# Patient Record
Sex: Female | Born: 1975 | Race: White | Hispanic: No | Marital: Married | Smoking: Never smoker
Health system: Southern US, Community
[De-identification: ages and names within clinical notes are randomized; demographics above are authoritative.]

---

## 2016-06-18 ENCOUNTER — Ambulatory Visit (INDEPENDENT_AMBULATORY_CARE_PROVIDER_SITE_OTHER): Payer: BLUE CROSS/BLUE SHIELD | Admitting: Sports Medicine

## 2016-06-18 ENCOUNTER — Encounter: Payer: Self-pay | Admitting: Sports Medicine

## 2016-06-18 VITALS — BP 110/79 | Ht 69.0 in | Wt 238.0 lb

## 2016-06-18 DIAGNOSIS — M722 Plantar fascial fibromatosis: Secondary | ICD-10-CM

## 2016-06-18 NOTE — Progress Notes (Signed)
   Subjective:    Patient ID: Kerry Snow, female    DOB: 03/06/1976, 40 y.o.   MRN: [id number]  HPI chief complaint: Left heel pain  Very pleasant 40 year old female comes in today at the request of Dr. Thurston HoleWainer for custom orthotics. She has a history of left heel plantar fasciitis. She last saw Dr. Thurston HoleWainer on September 18 and got a cortisone injection into her left heel. Her pain has improved but not resolved. She has had pain on and off for the past 2 years. She has tried multiple treatments but has not had custom orthotics.  Past medical history reviewed Medications reviewed Allergies reviewed    Review of Systems As above    Objective:   Physical Exam  Obese. No acute distress. Vital signs reviewed  Left heel: She is tender to palpation at the origin of the plantar fascia. Negative calcaneal squeeze. Fairly well-preserved longitudinal arch with standing but pronation with walking. Neurovascular intact distally.  X-rays of her left heel dated September 18 from Dr. Sherene SiresWainer's office show a rather large calcaneal plantar spur. Otherwise unremarkable.      Assessment & Plan:   Chronic left heel plantar fasciitis  Custom orthotics were constructed today. Patient found them to be comfortable prior to leaving the office. Total of 40 minutes was spent with the patient with greater than 50% of the time spent in face-to-face consultation discussing orthotic construction, instruction, and fitting. She will continue with her other treatments for plantar fasciitis and will follow-up with Dr. Thurston HoleWainer as scheduled. Follow-up with me as needed.

## 2017-07-17 ENCOUNTER — Encounter: Payer: Self-pay | Admitting: Allergy & Immunology

## 2018-01-01 ENCOUNTER — Other Ambulatory Visit: Payer: Self-pay | Admitting: Family Medicine

## 2018-01-01 ENCOUNTER — Ambulatory Visit
Admission: RE | Admit: 2018-01-01 | Discharge: 2018-01-01 | Disposition: A | Discharge status: Home / Self Care | Payer: BC Managed Care – PPO | Source: Ambulatory Visit | Attending: Family Medicine | Admitting: Family Medicine

## 2018-01-01 DIAGNOSIS — M546 Pain in thoracic spine: Secondary | ICD-10-CM

## 2018-01-15 ENCOUNTER — Encounter: Payer: Self-pay | Admitting: Physical Therapy

## 2018-01-15 ENCOUNTER — Other Ambulatory Visit: Payer: Self-pay

## 2018-01-15 ENCOUNTER — Ambulatory Visit: Payer: BC Managed Care – PPO | Attending: Family Medicine | Admitting: Physical Therapy

## 2018-01-15 DIAGNOSIS — M545 Low back pain, unspecified: Secondary | ICD-10-CM
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
## 2018-01-17 ENCOUNTER — Encounter: Payer: Self-pay | Admitting: Physical Therapy

## 2018-01-17 ENCOUNTER — Ambulatory Visit: Payer: BC Managed Care – PPO | Admitting: Physical Therapy

## 2018-01-17 DIAGNOSIS — M545 Low back pain, unspecified: Secondary | ICD-10-CM
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
## 2018-01-21 ENCOUNTER — Encounter: Payer: Self-pay | Admitting: Physical Therapy

## 2018-01-21 ENCOUNTER — Ambulatory Visit: Payer: BC Managed Care – PPO | Admitting: Physical Therapy

## 2018-01-21 DIAGNOSIS — M545 Low back pain, unspecified: Secondary | ICD-10-CM
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
## 2018-01-23 ENCOUNTER — Ambulatory Visit: Payer: BC Managed Care – PPO | Attending: Family Medicine | Admitting: Physical Therapy

## 2018-01-23 ENCOUNTER — Encounter: Payer: Self-pay | Admitting: Physical Therapy

## 2018-01-23 DIAGNOSIS — M545 Low back pain, unspecified: Secondary | ICD-10-CM
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
## 2018-01-28 ENCOUNTER — Ambulatory Visit: Payer: BC Managed Care – PPO | Admitting: Physical Therapy

## 2018-01-28 ENCOUNTER — Encounter: Payer: Self-pay | Admitting: Physical Therapy

## 2018-01-28 DIAGNOSIS — M545 Low back pain, unspecified: Secondary | ICD-10-CM
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
## 2018-01-30 ENCOUNTER — Encounter: Payer: Self-pay | Admitting: Physical Therapy

## 2018-01-30 ENCOUNTER — Ambulatory Visit: Payer: BC Managed Care – PPO | Admitting: Physical Therapy

## 2018-01-30 DIAGNOSIS — M545 Low back pain, unspecified: Secondary | ICD-10-CM

## 2018-01-30 NOTE — Therapy (Signed)
[facility] [address] [place], [place], [address] Phone: [phone]   Fax:  [phone]  Physical Therapy Treatment  Patient Details  Name: Kerry Snow MRN: [id number] Date of Birth: 10-May-1976 Referring Provider: Mady Gemma   Encounter Date: 01/30/2018  PT End of Session - 01/30/18 1426    Visit Number  6    Number of Visits  12    Date for PT Re-Evaluation  02/26/18    PT Start Time  1438    PT Stop Time  1519    PT Time Calculation (min)  41 min    Activity Tolerance  Patient tolerated treatment well    Behavior During Therapy  [facility] for tasks assessed/performed       History reviewed. No pertinent past medical history.  History reviewed. No pertinent surgical history.  There were no vitals filed for this visit.  Subjective Assessment - 01/30/18 1426    Subjective  Reports her pain is the same as Tuesday. Reports that she has a migraine today as well.    How long can you sit comfortably?  20 minutes.    Diagnostic tests  X-ray.    Currently in Pain?  Yes    Pain Score  7     Pain Location  Back    Pain Orientation  Right;Mid;Lower    Pain Descriptors / Indicators  Aching    Pain Type  Acute pain    Pain Onset  1 to 4 weeks ago    Pain Frequency  Constant         [facility] PT Assessment - 01/30/18 0001      Assessment   Medical Diagnosis  Thoracic back pain.    Onset Date/Surgical Date  12/25/17    Next MD Visit  None      Precautions   Precautions  None      Restrictions   Weight Bearing Restrictions  No                   [facility] Adult PT Treatment/Exercise - 01/30/18 0001      Modalities   Modalities  Electrical Stimulation;Moist Heat;Ultrasound      Moist Heat Therapy   Number Minutes Moist Heat  15 Minutes    Moist Heat Location  Lumbar Spine      Electrical Stimulation   Electrical Stimulation Location  R thoracolumbar paraspinals    Electrical Stimulation Action  Pre-Mod     Electrical Stimulation Parameters  80-150 hz x15 min    Electrical Stimulation Goals  Pain      Ultrasound   Ultrasound Location  R lumbar paraspinals/ QL    Ultrasound Parameters  Combo 1.5 w/cm2, 100%, 1 mhz x10 min    Ultrasound Goals  Pain      Manual Therapy   Manual Therapy  Soft tissue mobilization    Myofascial Release  STW/MFR to R thoracolumbar paraspinals and QL to reduce muscle tightness and stiffness in prone; Thoracic and lumbar mobs as well as costovertebral mobs completed by [place] Applegate, MPT                  PT Long Term Goals - 01/15/18 1354      PT LONG TERM GOAL #1   Title  Ind with a HEP.    Time  6    Period  Weeks    Status  New      PT LONG TERM GOAL #2  Title  Sit 30 minutes with pain not > 2/10.    Time  6    Period  Weeks    Status  New      PT LONG TERM GOAL #3   Title  Perform ADL's with pain not > 2/10.    Time  6    Period  Weeks    Status  New            Plan - 01/30/18 1520    Clinical Impression Statement  Patient tolerated today's treatment fairly well as she arrived with increased R LBP as well as headache symptoms. Moderate R QL tightness palpated today and minimal to moderate low trap region tightness. Patient reported manual therapy to R low back feeling like palpating bruising. Patient also reported experiencing mid back stiffness to wich [place] Applegate, MPT completed thoracic and lumbar mobs as well as costovertebral mobs to reduce spinal stiffness. Normal modalities response noted following removal of the modalities.     Rehab Potential  Excellent    PT Frequency  2x / week    PT Duration  6 weeks    PT Treatment/Interventions  ADLs/Self Care Home Management;Cryotherapy;Electrical Stimulation;Ultrasound;Moist Heat;Therapeutic activities;Therapeutic exercise;Patient/family education;Manual techniques;Dry needling    PT Next Visit Plan  Assess conservative treatment response and possible progression to  strengthening.    Consulted and Agree with Plan of Care  Patient       Patient will benefit from skilled therapeutic intervention in order to improve the following deficits and impairments:  Pain  Visit Diagnosis: Acute right-sided low back pain without sciatica     Problem List There are no active problems to display for this patient.   Marvell Fuller, PTA 01/30/2018, 3:26 PM  [facility] [address] [place], [place], [address] Phone: [phone]   Fax:  [phone]  Name: Shima Compere MRN: [id number] Date of Birth: 03/05/1976

## 2018-01-30 NOTE — Therapy (Deleted)
[facility] [address] [place], [place], [address] Phone: [phone]   Fax:  [phone]  Jan 30, 2018   @  Physical Therapy Discharge Summary  Patient: Kerry Snow  MRN: [id number]  Date of Birth: 02-10-1976   Diagnosis: Acute right-sided low back pain without sciatica Referring Provider: Mady Gemma   The above patient had been seen in Physical Therapy *** times of *** treatments scheduled with *** no shows and *** cancellations.  The treatment consisted of *** The patient is: {improved/worse/unchanged:3041574}  Subjective: ***  Discharge Findings: ***  Functional Status at Discharge: ***  {XLKGM:[id number]}  Plan - 01/30/18 1520    Clinical Impression Statement  Patient tolerated today's treatment fairly well as she arrived with increased R LBP as well as headache symptoms. Moderate R QL tightness palpated today and minimal to moderate low trap region tightness. Patient reported manual therapy to R low back feeling like palpating bruising. Patient also reported experiencing mid back stiffness to wich [place] Applegate, MPT completed thoracic and lumbar mobs as well as costovertebral mobs to reduce spinal stiffness. Normal modalities response noted following removal of the modalities.     Rehab Potential  Excellent    PT Frequency  2x / week    PT Duration  6 weeks    PT Treatment/Interventions  ADLs/Self Care Home Management;Cryotherapy;Electrical Stimulation;Ultrasound;Moist Heat;Therapeutic activities;Therapeutic exercise;Patient/family education;Manual techniques;Dry needling    PT Next Visit Plan  Assess conservative treatment response and possible progression to strengthening.    Consulted and Agree with Plan of Care  Patient       Sincerely,   Marvell Fuller, PTA   CC @  [facility] [address] [place], [place], [address] Phone:  [phone]   Fax:  [phone]  Patient: Kerry Snow  MRN: [id number]  Date of Birth: Mar 31, 1976

## 2018-02-04 ENCOUNTER — Encounter: Payer: Self-pay | Admitting: Physical Therapy

## 2018-02-04 ENCOUNTER — Ambulatory Visit: Payer: BC Managed Care – PPO | Admitting: Physical Therapy

## 2018-02-04 DIAGNOSIS — M545 Low back pain, unspecified: Secondary | ICD-10-CM

## 2018-02-04 NOTE — Therapy (Signed)
[facility] [address] [place], [place], [address] Phone: [phone]   Fax:  [phone]  Physical Therapy Treatment  Patient Details  Name: Kerry Snow MRN: [id number] Date of Birth: 19-Oct-1975 Referring Provider: Mady Gemma   Encounter Date: 02/04/2018  PT End of Session - 02/04/18 1355    Visit Number  7    Number of Visits  12    Date for PT Re-Evaluation  02/26/18    PT Start Time  1349    PT Stop Time  1436    PT Time Calculation (min)  47 min    Activity Tolerance  Patient tolerated treatment well    Behavior During Therapy  [facility] for tasks assessed/performed       History reviewed. No pertinent past medical history.  History reviewed. No pertinent surgical history.  There were no vitals filed for this visit.  Subjective Assessment - 02/04/18 1348    Subjective  Reports that she rested over the weekend and even took yesterday off as she felt she had a stomach bug over the weekend. Reports that she has lower LBP but feels it predominately in upper R back.     How long can you sit comfortably?  20 minutes and depends on the type of chair    How long can you stand comfortably?  approx. 15 minutes    Diagnostic tests  X-ray.    Currently in Pain?  Yes    Pain Score  3     Pain Location  Back    Pain Orientation  Right;Mid;Lower    Pain Descriptors / Indicators  Discomfort    Pain Type  Acute pain    Pain Onset  1 to 4 weeks ago    Pain Frequency  Intermittent         [facility] PT Assessment - 02/04/18 0001      Assessment   Medical Diagnosis  Thoracic back pain.    Onset Date/Surgical Date  12/25/17    Next MD Visit  None      Precautions   Precautions  None      Restrictions   Weight Bearing Restrictions  No                   [facility] Adult PT Treatment/Exercise - 02/04/18 0001      Exercises   Exercises  Lumbar      Lumbar Exercises: Aerobic   Nustep  L4 x16 min      Modalities    Modalities  Electrical Stimulation;Moist Heat;Ultrasound      Moist Heat Therapy   Number Minutes Moist Heat  15 Minutes    Moist Heat Location  Lumbar Spine      Electrical Stimulation   Electrical Stimulation Location  R thoracolumbar paraspinals    Electrical Stimulation Action  Pre-Mod    Electrical Stimulation Parameters  80-150 hz x15 min    Electrical Stimulation Goals  Pain      Ultrasound   Ultrasound Location  R thoracolumbar paraspinals    Ultrasound Parameters  Combo 1.5 w/cm2, 100%, x10 min    Ultrasound Goals  Pain      Manual Therapy   Manual Therapy  --                  PT Long Term Goals - 02/04/18 1358      PT LONG TERM GOAL #1   Title  Ind with a HEP.  Time  6    Period  Weeks    Status  On-going      PT LONG TERM GOAL #2   Title  Sit 30 minutes with pain not > 2/10.    Time  6    Period  Weeks    Status  On-going      PT LONG TERM GOAL #3   Title  Perform ADL's with pain not > 2/10.    Time  6    Period  Weeks    Status  On-going 5-6/10 as of 02/04/2018            Plan - 02/04/18 1426    Clinical Impression Statement  Patient tolerated today's treatment well and presented with less pain in R low back. Patient able to try low level exercise on Nustep with instruction regarding core activation. More discomfort reported by patient in R upper back region per patient report. Normal modalities response noted following removal of the modalities.    Rehab Potential  Excellent    PT Frequency  2x / week    PT Duration  6 weeks    PT Treatment/Interventions  ADLs/Self Care Home Management;Cryotherapy;Electrical Stimulation;Ultrasound;Moist Heat;Therapeutic activities;Therapeutic exercise;Patient/family education;Manual techniques;Dry needling    PT Next Visit Plan  Attempt progressing into more core/lumbar strengthening with modalities as symptoms dictate.    Consulted and Agree with Plan of Care  Patient       Patient will  benefit from skilled therapeutic intervention in order to improve the following deficits and impairments:  Pain  Visit Diagnosis: Acute right-sided low back pain without sciatica     Problem List There are no active problems to display for this patient.   Marvell Fuller, PTA 02/04/2018, 2:42 PM  [facility] [address] [place], [place], [address] Phone: [phone]   Fax:  [phone]  Name: Zlaty Alexa MRN: [id number] Date of Birth: 09/04/1976

## 2018-02-06 ENCOUNTER — Ambulatory Visit: Payer: BC Managed Care – PPO | Admitting: Physical Therapy

## 2018-02-06 ENCOUNTER — Encounter: Payer: Self-pay | Admitting: Physical Therapy

## 2018-02-06 DIAGNOSIS — M545 Low back pain, unspecified: Secondary | ICD-10-CM

## 2018-02-06 NOTE — Therapy (Signed)
[facility] [address] [place], [place], [address] Phone: [phone]   Fax:  [phone]  Physical Therapy Treatment  Patient Details  Name: Kerry Snow MRN: [id number] Date of Birth: 11-12-1975 Referring Provider: Mady Gemma   Encounter Date: 02/06/2018  PT End of Session - 02/06/18 1606    Visit Number  8    Number of Visits  12    Date for PT Re-Evaluation  02/26/18    PT Start Time  1604    PT Stop Time  1650    PT Time Calculation (min)  46 min    Activity Tolerance  Patient tolerated treatment well    Behavior During Therapy  [facility] for tasks assessed/performed       History reviewed. No pertinent past medical history.  History reviewed. No pertinent surgical history.  There were no vitals filed for this visit.  Subjective Assessment - 02/06/18 1604    Subjective  Reports that she has tried to be more conscious of monitoring symptoms. Reports that discomfort in low back is more dull ache and no longer sharp. Reports sleeping better as well with less stiffness.    How long can you sit comfortably?  20 minutes and depends on the type of chair    How long can you stand comfortably?  approx. 15 minutes    Diagnostic tests  X-ray.    Currently in Pain?  Yes    Pain Score  2     Pain Location  Back    Pain Orientation  Right;Mid;Lower    Pain Descriptors / Indicators  Dull;Aching    Pain Type  Acute pain    Pain Onset  1 to 4 weeks ago         [facility] PT Assessment - 02/06/18 0001      Assessment   Medical Diagnosis  Thoracic back pain.    Onset Date/Surgical Date  12/25/17    Next MD Visit  None      Precautions   Precautions  None      Restrictions   Weight Bearing Restrictions  No                   [facility] Adult PT Treatment/Exercise - 02/06/18 0001      Lumbar Exercises: Aerobic   Nustep  L5 x12 min      Lumbar Exercises: Standing   Row  Strengthening;Both;20 reps;Limitations    Row Limitations  Pink XTS with core activation    Shoulder Extension  Strengthening;Both;20 reps;Limitations    Shoulder Extension Limitations  Lat pulldown pink XTS with core activation      Modalities   Modalities  Electrical Stimulation;Moist Heat;Ultrasound      Moist Heat Therapy   Number Minutes Moist Heat  15 Minutes    Moist Heat Location  Lumbar Spine      Electrical Stimulation   Electrical Stimulation Location  R thoracolumbar paraspinals    Electrical Stimulation Action  Pre-Mod    Electrical Stimulation Parameters  80-150 hz x15 min    Electrical Stimulation Goals  Pain      Ultrasound   Ultrasound Location  R QL    Ultrasound Parameters  Combo 1.5 w/cm2, 100% x10 min    Ultrasound Goals  Pain                  PT Long Term Goals - 02/04/18 1358      PT LONG TERM GOAL #1  Title  Ind with a HEP.    Time  6    Period  Weeks    Status  On-going      PT LONG TERM GOAL #2   Title  Sit 30 minutes with pain not > 2/10.    Time  6    Period  Weeks    Status  On-going      PT LONG TERM GOAL #3   Title  Perform ADL's with pain not > 2/10.    Time  6    Period  Weeks    Status  On-going 5-6/10 as of 02/04/2018            Plan - 02/06/18 1642    Clinical Impression Statement  Patient arrived to PT with reports of only dull ache discomfort now and decreased intensity. Patient able to tolerate sitting if in a good chair per patient report. Patient able to tolerate initial postural strengthening well with only ache reported by patient more with row. Core activation VCs and education provided during standing postural strengthening. Patient very sensitive to [place] and palpation over R lateral QL/flank region. Normal modalities response noted following removal of the modalities. Goals remain on-going secondary to pain with ADLs, sitting.    Rehab Potential  Excellent    PT Frequency  2x / week    PT Duration  6 weeks    PT Treatment/Interventions  ADLs/Self  Care Home Management;Cryotherapy;Electrical Stimulation;Ultrasound;Moist Heat;Therapeutic activities;Therapeutic exercise;Patient/family education;Manual techniques;Dry needling    PT Next Visit Plan  Attempt progressing into more core/lumbar strengthening with modalities as symptoms dictate.    Consulted and Agree with Plan of Care  Patient       Patient will benefit from skilled therapeutic intervention in order to improve the following deficits and impairments:  Pain  Visit Diagnosis: Acute right-sided low back pain without sciatica     Problem List There are no active problems to display for this patient.   Marvell Fuller, PTA 02/06/2018, 5:36 PM  [facility] [address] [place], [place], [address] Phone: [phone]   Fax:  [phone]  Name: Kaydee Magel MRN: [id number] Date of Birth: 1976-01-27

## 2018-02-11 ENCOUNTER — Encounter: Payer: Self-pay | Admitting: Physical Therapy

## 2018-02-11 ENCOUNTER — Ambulatory Visit: Payer: BC Managed Care – PPO | Admitting: Physical Therapy

## 2018-02-11 DIAGNOSIS — M545 Low back pain, unspecified: Secondary | ICD-10-CM

## 2018-02-11 NOTE — Therapy (Signed)
[facility] [address] [place], [place], [address] Phone: [phone]   Fax:  [phone]  Physical Therapy Treatment  Patient Details  Name: Kerry Snow MRN: [id number] Date of Birth: 03-14-1976 Referring Provider: Mady Gemma   Encounter Date: 02/11/2018  PT End of Session - 02/11/18 1131    Visit Number  9    Number of Visits  12    Date for PT Re-Evaluation  02/26/18    PT Start Time  1121    PT Stop Time  1206    PT Time Calculation (min)  45 min    Activity Tolerance  Patient tolerated treatment well    Behavior During Therapy  [facility] for tasks assessed/performed       History reviewed. No pertinent past medical history.  History reviewed. No pertinent surgical history.  There were no vitals filed for this visit.  Subjective Assessment - 02/11/18 1123    Subjective  Reported sitting indian style on the floor this weekend and has had increased pain ever since. Reports having to take tylenol since incident. Denies any pain with sitting in recliner or other chairs but knows now not to sit in the floor.    How long can you sit comfortably?  20 minutes and depends on the type of chair    How long can you stand comfortably?  approx. 15 minutes    Diagnostic tests  X-ray.    Currently in Pain?  Yes    Pain Score  4     Pain Location  Back    Pain Orientation  Right;Lower    Pain Descriptors / Indicators  Discomfort    Pain Type  Acute pain    Pain Onset  1 to 4 weeks ago         [facility] PT Assessment - 02/11/18 0001      Assessment   Medical Diagnosis  Thoracic back pain.    Onset Date/Surgical Date  12/25/17    Next MD Visit  None      Precautions   Precautions  None      Restrictions   Weight Bearing Restrictions  No                   [facility] Adult PT Treatment/Exercise - 02/11/18 0001      Lumbar Exercises: Stretches   Lower Trunk Rotation  5 reps;30 seconds      Lumbar Exercises:  Aerobic   Nustep  L5 x17 min      Lumbar Exercises: Supine   Bridge  10 reps;4 seconds      Modalities   Modalities  Electrical Stimulation;Moist Heat      Moist Heat Therapy   Number Minutes Moist Heat  15 Minutes    Moist Heat Location  Lumbar Spine      Electrical Stimulation   Electrical Stimulation Location  R thoracolumbar paraspinals    Electrical Stimulation Action  Pre-Mod    Electrical Stimulation Parameters  80-150 hz x15 min    Electrical Stimulation Goals  Pain                  PT Long Term Goals - 02/04/18 1358      PT LONG TERM GOAL #1   Title  Ind with a HEP.    Time  6    Period  Weeks    Status  On-going      PT LONG TERM GOAL #2   Title  Sit 30 minutes with pain not > 2/10.    Time  6    Period  Weeks    Status  On-going      PT LONG TERM GOAL #3   Title  Perform ADL's with pain not > 2/10.    Time  6    Period  Weeks    Status  On-going 5-6/10 as of 02/04/2018            Plan - 02/11/18 1158    Clinical Impression Statement  Patient tolerated today's treatment well following minor set back due to sitting in [place] position over the weekend. Patient able to experience R flank stretch with LTR stretch. Lighter treatment completed today as to not exaggerate R low back discomfort. Normal modalities response noted following removal of the modalities. VCs provided throughout treatment to ensure proper technique and correct any deficits. Goal remain on-going at this time secondary to ADLs and sitting pain.    Rehab Potential  Excellent    PT Frequency  2x / week    PT Duration  6 weeks    PT Treatment/Interventions  ADLs/Self Care Home Management;Cryotherapy;Electrical Stimulation;Ultrasound;Moist Heat;Therapeutic activities;Therapeutic exercise;Patient/family education;Manual techniques;Dry needling    PT Next Visit Plan  Attempt progressing into more core/lumbar strengthening with modalities as symptoms dictate.    Consulted and Agree  with Plan of Care  Patient       Patient will benefit from skilled therapeutic intervention in order to improve the following deficits and impairments:  Pain  Visit Diagnosis: Acute right-sided low back pain without sciatica     Problem List There are no active problems to display for this patient.   Marvell Fuller, PTA 02/11/2018, 12:08 PM  [facility] [address] [place], [place], [address] Phone: [phone]   Fax:  [phone]  Name: Kerry Snow MRN: [id number] Date of Birth: 1976-03-21

## 2018-02-13 ENCOUNTER — Encounter: Payer: Self-pay | Admitting: Physical Therapy

## 2018-02-13 ENCOUNTER — Ambulatory Visit: Payer: BC Managed Care – PPO | Admitting: Physical Therapy

## 2018-02-13 DIAGNOSIS — M545 Low back pain, unspecified: Secondary | ICD-10-CM

## 2018-02-13 NOTE — Therapy (Addendum)
[facility] [address] [place], [place], [address] Phone: [phone]   Fax:  [phone]  Physical Therapy Treatment  Patient Details  Name: Kerry Snow MRN: [id number] Date of Birth: December 29, 1975 Referring Provider: Mady Gemma   Encounter Date: 02/13/2018  PT End of Session - 02/13/18 1041    Visit Number  10    Number of Visits  12    Date for PT Re-Evaluation  02/26/18    PT Start Time  1041    PT Stop Time  1122    PT Time Calculation (min)  41 min    Activity Tolerance  Patient tolerated treatment well    Behavior During Therapy  [facility] for tasks assessed/performed       History reviewed. No pertinent past medical history.  History reviewed. No pertinent surgical history.  There were no vitals filed for this visit.  Subjective Assessment - 02/13/18 1040    Subjective  Reports more distinctive pain today from lumbar to R flank. Reports like bruise sensation with palpation. More walking this morning.    How long can you sit comfortably?  20 minutes and depends on the type of chair    How long can you stand comfortably?  approx. 15 minutes    Diagnostic tests  X-ray.    Currently in Pain?  Yes    Pain Score  4     Pain Location  Back    Pain Orientation  Right    Pain Descriptors / Indicators  Discomfort;Tender    Pain Type  Acute pain    Pain Onset  1 to 4 weeks ago    Pain Frequency  Constant         [facility] PT Assessment - 02/13/18 0001      Assessment   Medical Diagnosis  Thoracic back pain.    Onset Date/Surgical Date  12/25/17    Next MD Visit  None      Precautions   Precautions  None      Restrictions   Weight Bearing Restrictions  No                   [facility] Adult PT Treatment/Exercise - 02/13/18 0001      Modalities   Modalities  Electrical Stimulation;Moist Heat;Ultrasound      Moist Heat Therapy   Number Minutes Moist Heat  15 Minutes    Moist Heat Location  Lumbar  Spine      Electrical Stimulation   Electrical Stimulation Location  R thoracolumbar paraspinals    Electrical Stimulation Action  Pre-Mod    Electrical Stimulation Parameters  80-150 hz x15 min    Electrical Stimulation Goals  Pain      Ultrasound   Ultrasound Location  R QL    Ultrasound Parameters  Combo 1.5 w/cm2, 100%, 1 mhz x10 min    Ultrasound Goals  Pain      Manual Therapy   Manual Therapy  Soft tissue mobilization    Soft tissue mobilization  STW to R QL, lumbar paraspinals to reduce muscle tightness and pain             PT Education - 02/13/18 1135    Education provided  Yes    Education Details  HEP- yoga QL stretch     Person(s) Educated  Patient    Methods  Explanation;Demonstration    Comprehension  Verbalized understanding          PT Long Term  Goals - 02/04/18 1358      PT LONG TERM GOAL #1   Title  Ind with a HEP.    Time  6    Period  Weeks    Status  On-going      PT LONG TERM GOAL #2   Title  Sit 30 minutes with pain not > 2/10.    Time  6    Period  Weeks    Status  On-going      PT LONG TERM GOAL #3   Title  Perform ADL's with pain not > 2/10.    Time  6    Period  Weeks    Status  On-going 5-6/10 as of 02/04/2018            Plan - 02/13/18 1129    Clinical Impression Statement  Patient tolerated today's treatment well although still experiencing "distinctive" discomfort in R low back. Patient continues to present with muscle tightness of R Ql and lumbar paraspinals with continued tenderness over R QL. Patient educated regarding DN in POC and getting home stim unit with patient understanding. PTA also demonstrated yoga QL stretch in kneeling with patient verbalzing understanding of technique. Normal modalities response noted following removal of the modalities.    Rehab Potential  Excellent    PT Frequency  2x / week    PT Duration  6 weeks    PT Treatment/Interventions  ADLs/Self Care Home Management;Cryotherapy;Electrical  Stimulation;Ultrasound;Moist Heat;Therapeutic activities;Therapeutic exercise;Patient/family education;Manual techniques;Dry needling    PT Next Visit Plan  Attempt progressing into more core/lumbar strengthening with modalities as symptoms dictate.    Consulted and Agree with Plan of Care  Patient       Patient will benefit from skilled therapeutic intervention in order to improve the following deficits and impairments:  Pain  Visit Diagnosis: Acute right-sided low back pain without sciatica     Problem List There are no active problems to display for this patient.   Marvell Fuller, PTA 02/13/2018, 11:37 AM  [facility] [address] [place], [place], [address] Phone: [phone]   Fax:  [phone]  Name: Kerry Snow MRN: [id number] Date of Birth: May 19, 1976  Progress Note Reporting Period 01/15/18 to 02/11/18  See note below for Objective Data and Assessment of Progress/Goals. Pain lower now since initial evaluation.  However, patient continues to c/o right sided low back pain that is not consistently low yet.  Patient considering dry needling as a treatment option.  [place] MPT

## 2018-02-18 ENCOUNTER — Encounter: Payer: Self-pay | Admitting: Physical Therapy

## 2018-02-18 ENCOUNTER — Ambulatory Visit: Payer: BC Managed Care – PPO | Admitting: Physical Therapy

## 2018-02-18 DIAGNOSIS — M545 Low back pain, unspecified: Secondary | ICD-10-CM

## 2018-02-18 NOTE — Therapy (Signed)
[facility] [address] [place], [place], [address] Phone: [phone]   Fax:  [phone]  Physical Therapy Treatment  Patient Details  Name: Kerry Snow MRN: [id number] Date of Birth: 1976-04-08 Referring Provider: Mady Gemma   Encounter Date: 02/18/2018  PT End of Session - 02/18/18 1735    Visit Number  11    Number of Visits  12    Date for PT Re-Evaluation  02/26/18    PT Start Time  0318    PT Stop Time  0408    PT Time Calculation (min)  50 min    Activity Tolerance  Patient tolerated treatment well    Behavior During Therapy  [facility] for tasks assessed/performed       History reviewed. No pertinent past medical history.  History reviewed. No pertinent surgical history.  There were no vitals filed for this visit.  Subjective Assessment - 02/18/18 1737    Subjective  Not doing too bad today.    Pain Score  3     Pain Location  Back    Pain Orientation  Right    Pain Descriptors / Indicators  Discomfort;Tender    Pain Type  Acute pain    Pain Onset  1 to 4 weeks ago                       [facility] Adult PT Treatment/Exercise - 02/18/18 0001      Exercises   Exercises  Knee/Hip      Lumbar Exercises: Aerobic   Nustep  Level 3 x 10 minutes.      Modalities   Modalities  Electrical Stimulation;Moist Heat      Moist Heat Therapy   Number Minutes Moist Heat  20 Minutes    Moist Heat Location  Lumbar Spine      Electrical Stimulation   Electrical Stimulation Location  Right low back.    Electrical Stimulation Action  Pre-mod.    Electrical Stimulation Parameters  80-150 Hz x 20 minutes.    Electrical Stimulation Goals  Pain      Manual Therapy   Manual Therapy  Soft tissue mobilization    Soft tissue mobilization  In prone;  STW/M including right QL release technique x 13 minutes.                  PT Long Term Goals - 02/04/18 1358      PT LONG TERM GOAL #1   Title   Ind with a HEP.    Time  6    Period  Weeks    Status  On-going      PT LONG TERM GOAL #2   Title  Sit 30 minutes with pain not > 2/10.    Time  6    Period  Weeks    Status  On-going      PT LONG TERM GOAL #3   Title  Perform ADL's with pain not > 2/10.    Time  6    Period  Weeks    Status  On-going 5-6/10 as of 02/04/2018            Plan - 02/18/18 1739    Clinical Impression Statement  Patient did well today though her QL was quite tender to palpation.  She was provided information on dry needling including an informed consent form as she is considering trying it.    PT Treatment/Interventions  ADLs/Self Care Home  Management;Cryotherapy;Electrical Stimulation;Ultrasound;Moist Heat;Therapeutic activities;Therapeutic exercise;Patient/family education;Manual techniques;Dry needling    PT Next Visit Plan  Attempt progressing into more core/lumbar strengthening with modalities as symptoms dictate.    Consulted and Agree with Plan of Care  Patient       Patient will benefit from skilled therapeutic intervention in order to improve the following deficits and impairments:     Visit Diagnosis: Acute right-sided low back pain without sciatica     Problem List There are no active problems to display for this patient.   Camara Rosander, [place] MPT 02/18/2018, 5:43 PM  [facility] [address] [place], [place], [address] Phone: [phone]   Fax:  [phone]  Name: Kerry Snow MRN: [id number] Date of Birth: 1976/05/10

## 2018-02-18 NOTE — Patient Instructions (Signed)
[facility]  DRY NEEDLING CONSENT FORM   Trigger point dry needling is a physical therapy approach to treat Myofascial Pain and Dysfunction.  Dry Needling (DN) is a valuable and effective way to deactivate myofascial trigger points (muscle knots/pain). It is skilled intervention that uses a thin filiform needle to penetrate the skin and stimulate underlying myofascial trigger points, muscular, and connective tissues for the management of neuromusculoskeletal pain and movement impairments.  A local twitch response (LTR) will be elicited.  This can sometimes feel like a deep ache in the muscle during the procedure. Multiple trigger points in multiple muscles can be treated during each treatment.  No medication of any kind is injected.   As with any medical treatment and procedure, there are possible adverse events.  While significant adverse events are uncommon, they do sometimes occur and must be considered prior to giving consent.  1. Dry needling often causes a "post needling soreness".  There can be an increase in pain from a couple of hours to 2-3 days, followed by an improvement in the overall pain state. 2. Any time a needle is used there is a risk of infection.  However, we are using new, sterile, and disposable needles; infections are extremely rare. 3. There is a possibility that you may bleed or bruise.  You may feel tired and some nausea following treatment. 4. There is a rare possibility of a pneumothorax (air in the chest cavity). 5. Allergic reaction to nickel in the stainless steel needle. 6. If a nerve is touched, it may cause paresthesia (a prickling/shock sensation) which is usually brief, but may continue for a couple of days.  Following treatment stay hydrated.  Continue regular activities but not too vigorous initially after treatment for 24-48 hours.  Dry Needling is best when combined with other physical therapy interventions such as  strengthening, stretching and other therapeutic modalities.   PLEASE ANSWER THE FOLLOWING QUESTIONS:  Do you have a lack of sensation?   Y/N  Do you have a phobia or fear of needles  Y/N  Are you pregnant?    Y/N If yes:  How many weeks? __________ Do you have any implanted devices?  Y/N If yes:  Pacemaker/Spinal Cord Stimulator/Deep Brain Stimulator/Insulin Pump/Other: ________________ Do you have any implants?  Y/N If yes: Breast/Facial/Pecs/Buttocks/Calves/Hip  Replacement/ Knee Replacement/Other: _________ Do you take any blood thinners?   Y/N If yes: Coumadin (Warfarin)/Other: ___________________ Do you have a bleeding disorder?   Y/N If yes: What kind: _________________________________ Do you take any immunosuppressants?  Y/N If yes:   What kind: _________________________________ Do you take anti-inflammatories?   Y/N If yes: What kind: Advil/Aspirin/Other: ________________ Have you ever been diagnosed with Scoliosis? Y/N Have you had back surgery?   Y/N If yes:  Laminectomy/Fusion/Other: ___________________   I have read, or had read to me, the above.  I have had the opportunity to ask any questions.  All of my questions have been answered to my satisfaction and I understand the risks involved with dry needling.  I consent to examination and treatment at [facility], including dry needling, of any and all of my involved and affected muscles.  

## 2018-02-20 ENCOUNTER — Encounter: Payer: Self-pay | Admitting: Physical Therapy

## 2018-02-20 ENCOUNTER — Ambulatory Visit: Payer: BC Managed Care – PPO | Admitting: Physical Therapy

## 2018-02-20 DIAGNOSIS — M545 Low back pain, unspecified: Secondary | ICD-10-CM

## 2018-02-20 NOTE — Therapy (Signed)
[facility] [place], [place], [address] Phone: [phone]   Fax:  [phone]  Physical Therapy Treatment  Patient Details  Name: Anari Evitt MRN: [id number] Date of Birth: 1976-04-09 Referring Provider: Bing Matter   Encounter Date: 02/20/2018  PT End of Session - 02/20/18 1523    Visit Number  12    Number of Visits  12    Date for PT Re-Evaluation  02/26/18    PT Start Time  1520    PT Stop Time  1607    PT Time Calculation (min)  47 min    Activity Tolerance  Patient tolerated treatment well    Behavior During Therapy  [facility] for tasks assessed/performed       History reviewed. No pertinent past medical history.  History reviewed. No pertinent surgical history.  There were no vitals filed for this visit.  Subjective Assessment - 02/20/18 1522    Subjective  Reports that she really only experiences stiffness in her low back. Reports sitting a lot today secondary to increased work. Reports that the muscle tightness in R low back was improved in last treatment.    How long can you sit comfortably?  20 minutes and depends on the type of chair    How long can you stand comfortably?  approx. 15 minutes    Diagnostic tests  X-ray.    Currently in Pain?  Yes    Pain Score  2     Pain Location  Back    Pain Orientation  Right;Lower    Pain Descriptors / Indicators  Discomfort    Pain Type  Acute pain    Pain Onset  1 to 4 weeks ago    Pain Frequency  Intermittent         [facility] PT Assessment - 02/20/18 0001      Assessment   Medical Diagnosis  Thoracic back pain.    Onset Date/Surgical Date  12/25/17    Next MD Visit  None      Precautions   Precautions  None      Restrictions   Weight Bearing Restrictions  No                   [facility] Adult PT Treatment/Exercise - 02/20/18 0001      Lumbar Exercises: Aerobic   Nustep  Level 5 x 10 minutes.      Lumbar Exercises: Standing   Shoulder Extension  Strengthening;Both;20 reps;Limitations    Shoulder Extension Limitations  Lat pulldown with pink XTS and core activation    Other Standing Lumbar Exercises  B chop wood pink XTS x20 reps      Modalities   Modalities  Electrical Stimulation;Moist Heat      Moist Heat Therapy   Number Minutes Moist Heat  15 Minutes    Moist Heat Location  Lumbar Spine      Electrical Stimulation   Electrical Stimulation Location  Right low back.    Electrical Stimulation Action  Pre-Mod    Electrical Stimulation Parameters  80-150 hz x15 min    Electrical Stimulation Goals  Pain      Manual Therapy   Manual Therapy  Soft tissue mobilization    Soft tissue mobilization  STW to R lumbar paraspinals and QL to reduce muscle tightness                   PT Long Term Goals - 02/20/18 1524  PT LONG TERM GOAL #1   Title  Ind with a HEP.    Time  6    Period  Weeks    Status  Achieved      PT LONG TERM GOAL #2   Title  Sit 30 minutes with pain not > 2/10.    Time  6    Period  Weeks    Status  Partially Met Depends on type of chair as of 02/20/2018      PT LONG TERM GOAL #3   Title  Perform ADL's with pain not > 2/10.    Time  6    Period  Weeks    Status  On-going 5-6/10 as of 02/04/2018            Plan - 02/20/18 1555    Clinical Impression Statement  Patient tolerated today's treatment well and able to progress to more functional core/lumbar strengthening. No complaints with standing core/lumbar strengthening exercises with core activation VCs. Continued minimal to moderate muscle tightness in more lateral R QL palpated. Decreased intensity of discomfort with manual therapy to R QL per patient report but discomfort still present. Normal modalities response noted following removal of the modalities.    Rehab Potential  Excellent    PT Frequency  2x / week    PT Duration  6 weeks    PT Treatment/Interventions  ADLs/Self Care Home  Management;Cryotherapy;Electrical Stimulation;Ultrasound;Moist Heat;Therapeutic activities;Therapeutic exercise;Patient/family education;Manual techniques;Dry needling    PT Next Visit Plan  Attempt progressing into more core/lumbar strengthening with modalities as symptoms dictate.    Consulted and Agree with Plan of Care  Patient       Patient will benefit from skilled therapeutic intervention in order to improve the following deficits and impairments:  Pain  Visit Diagnosis: Acute right-sided low back pain without sciatica     Problem List There are no active problems to display for this patient.   Standley Brooking, PTA 02/20/2018, 5:02 PM  [facility] [place], [place], [address] Phone: [phone]   Fax:  [phone]  Name: Julliette Frentz MRN: [id number] Date of Birth: 03/29/1976

## 2018-02-25 ENCOUNTER — Ambulatory Visit: Payer: BC Managed Care – PPO | Attending: Family Medicine | Admitting: Physical Therapy

## 2018-02-25 ENCOUNTER — Encounter: Payer: Self-pay | Admitting: Physical Therapy

## 2018-02-25 DIAGNOSIS — M545 Low back pain, unspecified: Secondary | ICD-10-CM
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
## 2018-02-27 ENCOUNTER — Ambulatory Visit: Payer: BC Managed Care – PPO | Admitting: Physical Therapy

## 2018-02-27 ENCOUNTER — Encounter: Payer: Self-pay | Admitting: Physical Therapy

## 2018-02-27 DIAGNOSIS — M545 Low back pain, unspecified: Secondary | ICD-10-CM

## 2018-02-27 NOTE — Therapy (Signed)
[facility] [place], [place], [address] Phone: [phone]   Fax:  [phone]  Physical Therapy Treatment  Patient Details  Name: Kerry Snow MRN: [id number] Date of Birth: [birth year] Referring Provider: Bing Matter   Encounter Date: 02/27/2018  PT End of Session - 02/27/18 0820    Visit Number  14    Number of Visits  18    Date for PT Re-Evaluation  03/19/18    PT Start Time  0818    PT Stop Time  0901    PT Time Calculation (min)  43 min    Activity Tolerance  Patient tolerated treatment well    Behavior During Therapy  [facility] for tasks assessed/performed       History reviewed. No pertinent past medical history.  History reviewed. No pertinent surgical history.  There were no vitals filed for this visit.  Subjective Assessment - 02/27/18 0810    Subjective  Reports that she has felt better since DN and her back now feels like an annoying kind of pain.    How long can you sit comfortably?  20 minutes and depends on the type of chair    How long can you stand comfortably?  approx. 15 minutes    Diagnostic tests  X-ray.    Currently in Pain?  Yes    Pain Score  2     Pain Location  Back    Pain Orientation  Right;Lower    Pain Descriptors / Indicators  Discomfort    Pain Type  Acute pain    Pain Onset  1 to 4 weeks ago    Pain Frequency  Intermittent         [facility] PT Assessment - 02/27/18 0001      Assessment   Medical Diagnosis  Thoracic back pain.    Onset Date/Surgical Date  12/25/17    Next MD Visit  None      Precautions   Precautions  None      Restrictions   Weight Bearing Restrictions  No                   [facility] Adult PT Treatment/Exercise - 02/27/18 0001      Lumbar Exercises: Aerobic   Nustep  Level 5 x 10 minutes.      Lumbar Exercises: Standing   Shoulder Extension  Strengthening;Both;20 reps;Limitations    Shoulder Extension Limitations  Lat pulldown with  core activation and pink XTS    Other Standing Lumbar Exercises  B chop wood pink XTS x20 reps    Other Standing Lumbar Exercises  B horizontal abduction red theraband x20 reps; B D2 red therband x10 reps      Lumbar Exercises: Supine   Bridge  15 reps;3 seconds      Modalities   Modalities  Electrical Stimulation;Moist Heat      Moist Heat Therapy   Number Minutes Moist Heat  15 Minutes    Moist Heat Location  Lumbar Spine      Electrical Stimulation   Electrical Stimulation Location  R low back    Electrical Stimulation Action  Pre-Mod    Electrical Stimulation Parameters  80-150 hz x15 min    Electrical Stimulation Goals  Tone;Pain                  PT Long Term Goals - 02/27/18 7017      PT LONG TERM GOAL #1   Title  Ind with a HEP.    Time  6    Period  Weeks    Status  Achieved      PT LONG TERM GOAL #2   Title  Sit 30 minutes with pain not > 2/10.    Time  6    Period  Weeks    Status  Achieved As long as it is not the floor or appropriate chair 02/27/2018      PT LONG TERM GOAL #3   Title  Perform ADL's with pain not > 2/10.    Time  6    Period  Weeks    Status  Partially Met Still has some pain with activities 02/27/2018            Plan - 02/27/18 0854    Clinical Impression Statement  Patient tolerated today's treatment well as she was progressed with low back and mid back strengthening. Patient instructed to complete exercises with core activation throughout treatment. No other complaints besides shoulder fatigue with exercises. No abnormal increased R QL tightness upon palpation of R low back. Normal modalites response noted following removal of the modalities.       Patient will benefit from skilled therapeutic intervention in order to improve the following deficits and impairments:     Visit Diagnosis: Acute right-sided low back pain without sciatica     Problem List There are no active problems to display for this  patient.   Standley Brooking, PTA 02/27/2018, 9:06 AM  [facility] [address] [place], [place], [address] Phone: [phone]   Fax:  [phone]  Name: Kerry Snow MRN: [id number] Date of Birth: 1976/05/06

## 2018-03-04 ENCOUNTER — Ambulatory Visit: Payer: BC Managed Care – PPO | Admitting: Physical Therapy

## 2018-03-04 ENCOUNTER — Encounter: Payer: Self-pay | Admitting: Physical Therapy

## 2018-03-04 DIAGNOSIS — M545 Low back pain, unspecified: Secondary | ICD-10-CM

## 2018-03-04 NOTE — Therapy (Signed)
[facility] [place], [place], [address] Phone: [phone]   Fax:  [phone]  Physical Therapy Treatment  Patient Details  Name: Kerry Snow MRN: [id number] Date of Birth: 01-03-1976 Referring Provider: Bing Matter   Encounter Date: 03/04/2018  PT End of Session - 03/04/18 0905    Visit Number  15    Number of Visits  18    Date for PT Re-Evaluation  03/19/18    PT Start Time  0902    PT Stop Time  0948    PT Time Calculation (min)  46 min    Activity Tolerance  Patient tolerated treatment well    Behavior During Therapy  [facility] for tasks assessed/performed       History reviewed. No pertinent past medical history.  History reviewed. No pertinent surgical history.  There were no vitals filed for this visit.  Subjective Assessment - 03/04/18 0904    Subjective  Reports that she sat through graduation and can pinpoint an area that is bothersome since sitting through graduation for an hour and a half.    How long can you sit comfortably?  20 minutes and depends on the type of chair    How long can you stand comfortably?  approx. 15 minutes    Diagnostic tests  X-ray.    Currently in Pain?  Yes    Pain Score  3  8/10 with manual palpation    Pain Location  Back    Pain Orientation  Right;Mid    Pain Descriptors / Indicators  Discomfort    Pain Type  Acute pain    Pain Onset  More than a month ago    Pain Frequency  Intermittent         [facility] PT Assessment - 03/04/18 0001      Assessment   Medical Diagnosis  Thoracic back pain.    Onset Date/Surgical Date  12/25/17    Next MD Visit  None      Precautions   Precautions  None      Restrictions   Weight Bearing Restrictions  No                   [facility] Adult PT Treatment/Exercise - 03/04/18 0001      Lumbar Exercises: Aerobic   Nustep  L5 x16 min      Modalities   Modalities  Electrical Stimulation;Moist Heat;Ultrasound      Moist Heat Therapy   Number Minutes Moist Heat  15 Minutes    Moist Heat Location  Lumbar Spine      Electrical Stimulation   Electrical Stimulation Location  R low back    Electrical Stimulation Action  Pre-Mod    Electrical Stimulation Parameters  80-150 hz x15 min    Electrical Stimulation Goals  Tone;Pain      Ultrasound   Ultrasound Location  R low back/ flank    Ultrasound Parameters  1.5 w/cm2, 100%, 1 mhz x10 min    Ultrasound Goals  Pain                  PT Long Term Goals - 02/27/18 3785      PT LONG TERM GOAL #1   Title  Ind with a HEP.    Time  6    Period  Weeks    Status  Achieved      PT LONG TERM GOAL #2   Title  Sit 30 minutes with  pain not > 2/10.    Time  6    Period  Weeks    Status  Achieved As long as it is not the floor or appropriate chair 02/27/2018      PT LONG TERM GOAL #3   Title  Perform ADL's with pain not > 2/10.    Time  6    Period  Weeks    Status  Partially Met Still has some pain with activities 02/27/2018            Plan - 03/04/18 0940    Clinical Impression Statement  Patient tolerated today's treatment well although she reported a pinpoint area of R low to mid back discomfort along flank region. No increased pain reported during Nustep session. [place] completed over area pinpointed by patient with tenderness reported. Normal modalities response noted following removal of the modalities. Patient eager to see if DN will assist with the R mid-low back/flank discomfort reported today.    Rehab Potential  Excellent    PT Frequency  2x / week    PT Duration  6 weeks    PT Treatment/Interventions  ADLs/Self Care Home Management;Cryotherapy;Electrical Stimulation;Ultrasound;Moist Heat;Therapeutic activities;Therapeutic exercise;Patient/family education;Manual techniques;Dry needling    PT Next Visit Plan  Attempt progressing into more core/lumbar strengthening with modalities as symptoms dictate.    Consulted and Agree with Plan of  Care  Patient       Patient will benefit from skilled therapeutic intervention in order to improve the following deficits and impairments:  Pain  Visit Diagnosis: Acute right-sided low back pain without sciatica     Problem List There are no active problems to display for this patient.   Standley Brooking, PTA 03/04/2018, 9:53 AM  [facility] [address] [place], [place], [address] Phone: [phone]   Fax:  [phone]  Name: Kerry Snow MRN: [id number] Date of Birth: [birth year]

## 2018-03-06 ENCOUNTER — Encounter: Payer: Self-pay | Admitting: Physical Therapy

## 2018-03-06 ENCOUNTER — Ambulatory Visit: Payer: BC Managed Care – PPO | Admitting: Physical Therapy

## 2018-03-06 DIAGNOSIS — M545 Low back pain, unspecified: Secondary | ICD-10-CM

## 2018-03-06 NOTE — Therapy (Signed)
[facility] [place], [place], [address] Phone: [phone]   Fax:  [phone]  Physical Therapy Treatment  Patient Details  Name: Kerry Snow MRN: [id number] Date of Birth: 03-28-1976 Referring Provider: Bing Matter   Encounter Date: 03/06/2018  PT End of Session - 03/06/18 1553    Visit Number  16    Number of Visits  18    Date for PT Re-Evaluation  03/19/18    PT Start Time  0348    PT Stop Time  0444    PT Time Calculation (min)  56 min    Activity Tolerance  Patient tolerated treatment well    Behavior During Therapy  [facility] for tasks assessed/performed       History reviewed. No pertinent past medical history.  History reviewed. No pertinent surgical history.  There were no vitals filed for this visit.  Subjective Assessment - 03/06/18 1553    Subjective  I lifted a projector and twisted and flared my back up.    Pain Score  5     Pain Location  Back    Pain Orientation  Right;Mid    Pain Descriptors / Indicators  Discomfort    Pain Type  Acute pain    Pain Onset  More than a month ago    Multiple Pain Sites  No                       [facility] Adult PT Treatment/Exercise - 03/06/18 0001      Exercises   Exercises  Knee/Hip      Lumbar Exercises: Aerobic   Nustep  Level 4 x 15 minutes.      Moist Heat Therapy   Number Minutes Moist Heat  20 Minutes    Moist Heat Location  Lumbar Spine      Electrical Stimulation   Electrical Stimulation Location  Right low back.    Electrical Stimulation Action  Pre-mod.    Electrical Stimulation Parameters  80-150 Hz x 20 minutes.    Electrical Stimulation Goals  Tone;Pain      Manual Therapy   Soft tissue mobilization  Patient in left sdly position over folded pillow:   Performed right QL release technique x 8 minutes.       Trigger Point Dry Needling - 03/06/18 1702    Consent Given?  Yes    Muscles Treated Upper Body  Quadratus  Lumborum Right QL.  Excellent twitch response.                PT Long Term Goals - 02/27/18 1194      PT LONG TERM GOAL #1   Title  Ind with a HEP.    Time  6    Period  Weeks    Status  Achieved      PT LONG TERM GOAL #2   Title  Sit 30 minutes with pain not > 2/10.    Time  6    Period  Weeks    Status  Achieved As long as it is not the floor or appropriate chair 02/27/2018      PT LONG TERM GOAL #3   Title  Perform ADL's with pain not > 2/10.    Time  6    Period  Weeks    Status  Partially Met Still has some pain with activities 02/27/2018            Plan - 03/06/18  1706    Clinical Impression Statement  Excellent response to dry needling to patient's right QL.  She felt much better after treatment.  She noted how much better she was able to tolerate STW/M to this region after being dry needled.      PT Treatment/Interventions  ADLs/Self Care Home Management;Cryotherapy;Electrical Stimulation;Ultrasound;Moist Heat;Therapeutic activities;Therapeutic exercise;Patient/family education;Manual techniques;Dry needling    PT Next Visit Plan  Attempt progressing into more core/lumbar strengthening with modalities as symptoms dictate.    Consulted and Agree with Plan of Care  Patient       Patient will benefit from skilled therapeutic intervention in order to improve the following deficits and impairments:     Visit Diagnosis: Acute right-sided low back pain without sciatica     Problem List There are no active problems to display for this patient.   Jonel Sick, [place] MPT 03/06/2018, 5:10 PM  [facility] [address] [place], [place], [address] Phone: [phone]   Fax:  [phone]  Name: Fumiye Lubben MRN: [id number] Date of Birth: 12-04-1975

## 2018-03-10 ENCOUNTER — Encounter: Payer: Self-pay | Admitting: Physical Therapy

## 2018-03-10 ENCOUNTER — Ambulatory Visit: Payer: BC Managed Care – PPO | Admitting: Physical Therapy

## 2018-03-10 DIAGNOSIS — M545 Low back pain, unspecified: Secondary | ICD-10-CM

## 2018-03-10 NOTE — Therapy (Addendum)
[facility] [place], [place], [address] Phone: [phone]   Fax:  [phone]  Physical Therapy Treatment  Patient Details  Name: Kerry Snow MRN: [id number] Date of Birth: [birth year] Referring Provider: Bing Matter   Encounter Date: 03/10/2018  PT End of Session - 03/10/18 0950    Visit Number  17    Number of Visits  18    Date for PT Re-Evaluation  03/19/18    PT Start Time  0949    PT Stop Time  1033    PT Time Calculation (min)  44 min    Activity Tolerance  Patient tolerated treatment well    Behavior During Therapy  [facility] for tasks assessed/performed       History reviewed. No pertinent past medical history.  History reviewed. No pertinent surgical history.  There were no vitals filed for this visit.  Subjective Assessment - 03/10/18 0941    Subjective  More pain reported in lower thoracic in R mid back and reported catching her breath.    How long can you sit comfortably?  20 minutes and depends on the type of chair    How long can you stand comfortably?  approx. 15 minutes    Diagnostic tests  X-ray.    Currently in Pain?  Yes    Pain Score  8     Pain Location  Back    Pain Orientation  Right;Mid    Pain Descriptors / Indicators  Discomfort    Pain Type  Acute pain    Pain Onset  More than a month ago    Pain Frequency  Intermittent         [facility] PT Assessment - 03/10/18 0001      Assessment   Medical Diagnosis  Thoracic back pain.    Onset Date/Surgical Date  12/25/17    Next MD Visit  None      Precautions   Precautions  None      Restrictions   Weight Bearing Restrictions  No                   [facility] Adult PT Treatment/Exercise - 03/10/18 0001      Lumbar Exercises: Aerobic   Nustep  Level 4 x 12 minutes.      Modalities   Modalities  Electrical Stimulation;Moist Heat;Ultrasound      Moist Heat Therapy   Number Minutes Moist Heat  15 Minutes    Moist  Heat Location  Lumbar Spine      Electrical Stimulation   Electrical Stimulation Location  R lumbar paraspinals    Electrical Stimulation Action  Pre-Mod    Electrical Stimulation Parameters  80-150 hz x15 min    Electrical Stimulation Goals  Tone;Pain      Ultrasound   Ultrasound Location  R lumbar paraspinals    Ultrasound Parameters  Combo 1.5 w/cm2, 100%, 1 mhz x10 min    Ultrasound Goals  Pain      Manual Therapy   Manual Therapy  Soft tissue mobilization    Soft tissue mobilization  STW to R lumbar paraspinals to reduce muscle tightness and pain                  PT Long Term Goals - 02/27/18 0814      PT LONG TERM GOAL #1   Title  Ind with a HEP.    Time  6    Period  Weeks  Status  Achieved      PT LONG TERM GOAL #2   Title  Sit 30 minutes with pain not > 2/10.    Time  6    Period  Weeks    Status  Achieved As long as it is not the floor or appropriate chair 02/27/2018      PT LONG TERM GOAL #3   Title  Perform ADL's with pain not > 2/10.    Time  6    Period  Weeks    Status  Partially Met Still has some pain with activities 02/27/2018            Plan - 03/10/18 1024    Clinical Impression Statement  Patient arrived in clinic with reports of continued pain and muscle tightness in R low back. Patient arrived with increased R LBP to approximately 8-9/10 intermittantly. Patient reported increased pain with Nustep as well as STW. STW terminated early due to pain and to avoid extreme pain as patient has to return to work. Increased muscle tightness palpated in R lateral fibers of lumbar paraspinals specifically between approximately T12-L3. Normal modalities response noted following removal of the modalities. Patient reported reduction of R LBP upon end of treatment.    Rehab Potential  Excellent    PT Frequency  2x / week    PT Duration  6 weeks    PT Treatment/Interventions  ADLs/Self Care Home Management;Cryotherapy;Electrical  Stimulation;Ultrasound;Moist Heat;Therapeutic activities;Therapeutic exercise;Patient/family education;Manual techniques;Dry needling    PT Next Visit Plan  Attempt progressing into more core/lumbar strengthening with modalities as symptoms dictate.    Consulted and Agree with Plan of Care  Patient       Patient will benefit from skilled therapeutic intervention in order to improve the following deficits and impairments:  Pain  Visit Diagnosis: Acute right-sided low back pain without sciatica     Problem List There are no active problems to display for this patient.   Standley Brooking, PTA 03/10/2018, 10:38 AM  [facility] [address] [place], [place], [address] Phone: [phone]   Fax:  [phone]  Name: Kerry Snow MRN: [id number] Date of Birth: [birth year]

## 2018-03-13 ENCOUNTER — Encounter: Payer: Self-pay | Admitting: Physical Therapy

## 2018-03-13 ENCOUNTER — Ambulatory Visit: Payer: BC Managed Care – PPO | Admitting: Physical Therapy

## 2018-03-13 DIAGNOSIS — M545 Low back pain, unspecified: Secondary | ICD-10-CM

## 2018-03-13 NOTE — Therapy (Signed)
[facility] [place], [place], [address] Phone: [phone]   Fax:  [phone]  Physical Therapy Treatment  Patient Details  Name: Sriya Kroeze MRN: [id number] Date of Birth: 1976/04/26 Referring Provider: Bing Matter   Encounter Date: 03/13/2018  PT End of Session - 03/13/18 1803    Visit Number  18    Number of Visits  24    Date for PT Re-Evaluation  04/23/18    PT Start Time  0445    PT Stop Time  0541    PT Time Calculation (min)  56 min       History reviewed. No pertinent past medical history.  History reviewed. No pertinent surgical history.  There were no vitals filed for this visit.  Subjective Assessment - 03/13/18 1810    Subjective  I'm better.    Currently in Pain?  Yes    Pain Score  3     Pain Location  Back    Pain Orientation  Right    Pain Descriptors / Indicators  Discomfort    Pain Type  Acute pain    Pain Onset  More than a month ago                       [facility] Adult PT Treatment/Exercise - 03/13/18 0001      Exercises   Exercises  Knee/Hip      Lumbar Exercises: Aerobic   Nustep  Level 4 x 16 minutes.      Modalities   Modalities  Electrical Stimulation;Moist Heat      Moist Heat Therapy   Number Minutes Moist Heat  20 Minutes    Moist Heat Location  Lumbar Spine      Electrical Stimulation   Electrical Stimulation Location  Right low back.    Electrical Stimulation Action  IFC     Electrical Stimulation Parameters  80-150 Hz x 20 minutes.    Electrical Stimulation Goals  Tone;Pain      Manual Therapy   Manual Therapy  Soft tissue mobilization    Soft tissue mobilization  In prone:  Right QL release technique x 10 minutes.                  PT Long Term Goals - 02/27/18 6754      PT LONG TERM GOAL #1   Title  Ind with a HEP.    Time  6    Period  Weeks    Status  Achieved      PT LONG TERM GOAL #2   Title  Sit 30 minutes  with pain not > 2/10.    Time  6    Period  Weeks    Status  Achieved As long as it is not the floor or appropriate chair 02/27/2018      PT LONG TERM GOAL #3   Title  Perform ADL's with pain not > 2/10.    Time  6    Period  Weeks    Status  Partially Met Still has some pain with activities 02/27/2018            Plan - 03/13/18 1814    Clinical Impression Statement  Patient doing much better today with a pre-tx pain-level of 3/10 and less following treatment.  Excellent response to right Quadratus Lumborum release technique today.    PT Treatment/Interventions  ADLs/Self Care Home Management;Cryotherapy;Electrical Stimulation;Ultrasound;Moist Heat;Therapeutic activities;Therapeutic exercise;Patient/family education;Manual techniques;Dry  needling    PT Next Visit Plan  Attempt progressing into more core/lumbar strengthening with modalities as symptoms dictate.    Consulted and Agree with Plan of Care  Patient       Patient will benefit from skilled therapeutic intervention in order to improve the following deficits and impairments:     Visit Diagnosis: Acute right-sided low back pain without sciatica - Plan: PT plan of care cert/re-cert     Problem List There are no active problems to display for this patient.   Garrell Flagg, [place] MPT 03/13/2018, 6:17 PM  [facility] [address] [place], [place], [address] Phone: [phone]   Fax:  [phone]  Name: Raynelle Fujikawa MRN: [id number] Date of Birth: April 02, 1976

## 2018-03-19 ENCOUNTER — Encounter: Payer: Self-pay | Admitting: Physical Therapy

## 2018-03-19 ENCOUNTER — Ambulatory Visit: Payer: BC Managed Care – PPO | Admitting: Physical Therapy

## 2018-03-19 DIAGNOSIS — M545 Low back pain, unspecified: Secondary | ICD-10-CM

## 2018-03-19 NOTE — Therapy (Signed)
[facility] [place], [place], [address] Phone: [phone]   Fax:  [phone]  Physical Therapy Treatment  Patient Details  Name: Kerry Snow MRN: [id number] Date of Birth: 1975-12-06 Referring Provider: Bing Matter   Encounter Date: 03/19/2018  PT End of Session - 03/19/18 1152    Visit Number  19    Number of Visits  24    Date for PT Re-Evaluation  04/23/18    PT Start Time  1030    PT Stop Time  1124    PT Time Calculation (min)  54 min       History reviewed. No pertinent past medical history.  History reviewed. No pertinent surgical history.  There were no vitals filed for this visit.  Subjective Assessment - 03/19/18 1153    Subjective  When I turn my body I feel the pain.    Currently in Pain?  Yes    Pain Score  4     Pain Onset  More than a month ago                       [facility] Adult PT Treatment/Exercise - 03/19/18 0001      Exercises   Exercises  Knee/Hip      Lumbar Exercises: Aerobic   Nustep  Level 4 x 16 minutes.      Modalities   Modalities  Electrical Stimulation;Moist Heat      Moist Heat Therapy   Number Minutes Moist Heat  20 Minutes    Moist Heat Location  -- Lower thoracic.      Acupuncturist Location  Right lower thoracic region.    Electrical Stimulation Action  IFC    Electrical Stimulation Parameters  80-150 Hz x 20 minutes.    Electrical Stimulation Goals  Tone;Pain      Manual Therapy   Manual Therapy  Soft tissue mobilization    Soft tissue mobilization  In prone:  Right lower thoracic PA mobs and STW/M to paraspinal musculature (7 minutes).                  PT Long Term Goals - 02/27/18 9163      PT LONG TERM GOAL #1   Title  Ind with a HEP.    Time  6    Period  Weeks    Status  Achieved      PT LONG TERM GOAL #2   Title  Sit 30 minutes with pain not > 2/10.    Time  6    Period   Weeks    Status  Achieved As long as it is not the floor or appropriate chair 02/27/2018      PT LONG TERM GOAL #3   Title  Perform ADL's with pain not > 2/10.    Time  6    Period  Weeks    Status  Partially Met Still has some pain with activities 02/27/2018            Plan - 03/19/18 1154    Clinical Impression Statement  No right QL pain reported.  CC is in right lower thoracic region reproduced when driving and turning her trunk.  She did great with PA mobs today and felt good after treatment.    PT Treatment/Interventions  ADLs/Self Care Home Management;Cryotherapy;Electrical Stimulation;Ultrasound;Moist Heat;Therapeutic activities;Therapeutic exercise;Patient/family education;Manual techniques;Dry needling    PT Next Visit Plan  Attempt  progressing into more core/lumbar strengthening with modalities as symptoms dictate.    Consulted and Agree with Plan of Care  Patient       Patient will benefit from skilled therapeutic intervention in order to improve the following deficits and impairments:     Visit Diagnosis: Acute right-sided low back pain without sciatica     Problem List There are no active problems to display for this patient.   Carsten Carstarphen, [place] MPT 03/19/2018, 12:07 PM  [facility] [address] [place], [place], [address] Phone: [phone]   Fax:  [phone]  Name: Kerry Snow MRN: [id number] Date of Birth: Feb 18, 1976

## 2018-03-21 ENCOUNTER — Ambulatory Visit: Payer: BC Managed Care – PPO | Admitting: *Deleted

## 2018-03-21 DIAGNOSIS — M545 Low back pain, unspecified: Secondary | ICD-10-CM

## 2018-03-21 NOTE — Therapy (Signed)
[facility] [place], [place], [address] Phone: [phone]   Fax:  [phone]  Physical Therapy Treatment  Patient Details  Name: Kerry Snow MRN: [id number] Date of Birth: 08-01-1976 Referring Provider: Bing Matter   Encounter Date: 03/21/2018  PT End of Session - 03/21/18 1119    Visit Number  20    Number of Visits  24    Date for PT Re-Evaluation  04/23/18    PT Start Time  1115    PT Stop Time  5631    PT Time Calculation (min)  50 min       No past medical history on file.  No past surgical history on file.  There were no vitals filed for this visit.  Subjective Assessment - 03/21/18 1118    Subjective  Did ok after last visit. 3/10    How long can you sit comfortably?  20 minutes and depends on the type of chair    How long can you stand comfortably?  approx. 15 minutes    Diagnostic tests  X-ray.    Currently in Pain?  Yes    Pain Score  3     Pain Orientation  Right    Pain Onset  More than a month ago                       [facility] Adult PT Treatment/Exercise - 03/21/18 0001      Exercises   Exercises  Knee/Hip      Modalities   Modalities  Electrical Stimulation;Moist Heat      Moist Heat Therapy   Number Minutes Moist Heat  15 Minutes    Moist Heat Location  Lumbar Spine      Electrical Stimulation   Electrical Stimulation Location  Right lower thoracic region.    Electrical Stimulation Action  IFC    Electrical Stimulation Parameters  80-_0     Electrical Stimulation Goals  Tone;Pain      Ultrasound   Ultrasound Location  RT Lower thoracic area T10-12    Ultrasound Parameters  combo 1.5 w/cm2 x 10 mins    Ultrasound Goals  Pain      Manual Therapy   Manual Therapy  Soft tissue mobilization    Myofascial Release  STW/MFR to R thoracolumbar paraspinals in prone;                   PT Long Term Goals - 02/27/18 4970      PT LONG TERM GOAL #1    Title  Ind with a HEP.    Time  6    Period  Weeks    Status  Achieved      PT LONG TERM GOAL #2   Title  Sit 30 minutes with pain not > 2/10.    Time  6    Period  Weeks    Status  Achieved As long as it is not the floor or appropriate chair 02/27/2018      PT LONG TERM GOAL #3   Title  Perform ADL's with pain not > 2/10.    Time  6    Period  Weeks    Status  Partially Met Still has some pain with activities 02/27/2018            Plan - 03/21/18 1157    Clinical Impression Statement  Pt arrived today doing some better after last Rx, but reports that  her RT side midback and rib area continues to stay sore/painful 3-4/10 today. [place] combo was performed to this area as well as STW with TPR (ischemic release) Pt experienced some TPR ,but unable to get a full release. Normal modality response today.    Clinical Presentation  Stable    Clinical Decision Making  Low    Rehab Potential  Excellent    PT Frequency  2x / week    PT Duration  6 weeks    PT Treatment/Interventions  ADLs/Self Care Home Management;Cryotherapy;Electrical Stimulation;Ultrasound;Moist Heat;Therapeutic activities;Therapeutic exercise;Patient/family education;Manual techniques;Dry needling    PT Next Visit Plan  Attempt progressing into more core/lumbar strengthening with modalities as symptoms dictate.    Consulted and Agree with Plan of Care  Patient       Patient will benefit from skilled therapeutic intervention in order to improve the following deficits and impairments:  Pain  Visit Diagnosis: Acute right-sided low back pain without sciatica     Problem List There are no active problems to display for this patient.   RAMSEUR,CHRIS. PTA 03/21/2018, 12:05 PM  [facility] [place], [place], [address] Phone: [phone]   Fax:  [phone]  Name: Kerry Snow MRN: [id number] Date of Birth: [birth year]

## 2018-03-26 ENCOUNTER — Encounter: Payer: BC Managed Care – PPO | Admitting: Physical Therapy

## 2018-04-01 ENCOUNTER — Encounter: Payer: Self-pay | Admitting: Physical Therapy

## 2018-04-01 ENCOUNTER — Ambulatory Visit: Payer: BC Managed Care – PPO | Attending: Family Medicine | Admitting: Physical Therapy

## 2018-04-01 DIAGNOSIS — M545 Low back pain, unspecified: Secondary | ICD-10-CM

## 2018-04-01 NOTE — Therapy (Signed)
[facility] [place], [place], [address] Phone: [phone]   Fax:  [phone]  Physical Therapy Treatment  Patient Details  Name: Kerry Snow MRN: [id number] Date of Birth: [birth year] Referring Provider: Bing Matter   Encounter Date: 04/01/2018  PT End of Session - 04/01/18 1125    Visit Number  21    Number of Visits  24    Date for PT Re-Evaluation  04/23/18    PT Start Time  1033    PT Stop Time  1128    PT Time Calculation (min)  55 min    Activity Tolerance  Patient tolerated treatment well    Behavior During Therapy  [facility] for tasks assessed/performed       History reviewed. No pertinent past medical history.  History reviewed. No pertinent surgical history.  There were no vitals filed for this visit.  Subjective Assessment - 04/01/18 1126    Subjective  I feel like I may be getting to the end of this.  Very little pain today.    Currently in Pain?  Yes    Pain Score  1     Pain Location  Back    Pain Orientation  Right    Pain Descriptors / Indicators  Discomfort    Pain Type  Acute pain    Pain Onset  More than a month ago                       [facility] Adult PT Treatment/Exercise - 04/01/18 0001      Exercises   Exercises  Knee/Hip      Lumbar Exercises: Aerobic   Nustep  Level 5 x 17 minutes.      Modalities   Modalities  Electrical Stimulation;Moist Heat      Moist Heat Therapy   Number Minutes Moist Heat  20 Minutes    Moist Heat Location  Lumbar Spine      Electrical Stimulation   Electrical Stimulation Location  Right lower thoracic/upper lumbar.    Electrical Stimulation Action  IFC x 20 minutes.    Electrical Stimulation Goals  Pain      Manual Therapy   Manual Therapy  Soft tissue mobilization    Myofascial Release  STW/M x 6 minutes to right upper portion of her QL.                  PT Long Term Goals - 02/27/18 9937      PT LONG TERM  GOAL #1   Title  Ind with a HEP.    Time  6    Period  Weeks    Status  Achieved      PT LONG TERM GOAL #2   Title  Sit 30 minutes with pain not > 2/10.    Time  6    Period  Weeks    Status  Achieved As long as it is not the floor or appropriate chair 02/27/2018      PT LONG TERM GOAL #3   Title  Perform ADL's with pain not > 2/10.    Time  6    Period  Weeks    Status  Partially Met Still has some pain with activities 02/27/2018            Plan - 04/01/18 1142    Clinical Impression Statement  Patient very pleased with her progress.  Pain has been remaining consistently low.  Rehab Potential  Excellent    PT Treatment/Interventions  ADLs/Self Care Home Management;Cryotherapy;Electrical Stimulation;Ultrasound;Moist Heat;Therapeutic activities;Therapeutic exercise;Patient/family education;Manual techniques;Dry needling    PT Next Visit Plan  Attempt progressing into more core/lumbar strengthening with modalities as symptoms dictate.    Consulted and Agree with Plan of Care  Patient       Patient will benefit from skilled therapeutic intervention in order to improve the following deficits and impairments:     Visit Diagnosis: Acute right-sided low back pain without sciatica     Problem List There are no active problems to display for this patient.   Dyane Broberg, [place] MPT 04/01/2018, 11:52 AM  [facility] [address] [place], [place], [address] Phone: [phone]   Fax:  [phone]  Name: Kerry Snow MRN: [id number] Date of Birth: [birth year]

## 2018-04-03 ENCOUNTER — Encounter: Payer: Self-pay | Admitting: Physical Therapy

## 2018-04-03 ENCOUNTER — Ambulatory Visit: Payer: BC Managed Care – PPO | Admitting: Physical Therapy

## 2018-04-03 DIAGNOSIS — M545 Low back pain, unspecified: Secondary | ICD-10-CM

## 2018-04-03 NOTE — Therapy (Signed)
[facility] [place], [place], [address] Phone: [phone]   Fax:  [phone]  Physical Therapy Treatment  Patient Details  Name: Kerry Snow MRN: [id number] Date of Birth: July 20, 1976 Referring Provider: Bing Matter   Encounter Date: 04/03/2018  PT End of Session - 04/03/18 0835    Visit Number  22    Number of Visits  24    Date for PT Re-Evaluation  04/23/18    PT Start Time  0825    PT Stop Time  0911    PT Time Calculation (min)  46 min    Activity Tolerance  Patient tolerated treatment well    Behavior During Therapy  [facility] for tasks assessed/performed       History reviewed. No pertinent past medical history.  History reviewed. No pertinent surgical history.  There were no vitals filed for this visit.  Subjective Assessment - 04/03/18 0830    Subjective  Reports that she really feels better but the true test will be next week when she has to go to [place] and present.    How long can you sit comfortably?  20 minutes and depends on the type of chair    How long can you stand comfortably?  approx. 15 minutes    Diagnostic tests  X-ray.    Currently in Pain?  Yes    Pain Score  1     Pain Location  Back    Pain Orientation  Right;Lower    Pain Descriptors / Indicators  Discomfort;Other (Comment) Stiff    Pain Type  Acute pain    Pain Onset  More than a month ago         [facility] PT Assessment - 04/03/18 0001      Assessment   Medical Diagnosis  Thoracic back pain.    Onset Date/Surgical Date  12/25/17    Next MD Visit  None      Precautions   Precautions  None      Restrictions   Weight Bearing Restrictions  No                   [facility] Adult PT Treatment/Exercise - 04/03/18 0001      Lumbar Exercises: Aerobic   Nustep  L5 x16 min      Modalities   Modalities  Electrical Stimulation;Moist Heat      Moist Heat Therapy   Number Minutes Moist Heat  15 Minutes    Moist  Heat Location  Lumbar Spine      Electrical Stimulation   Electrical Stimulation Location  R lumbar paraspinals    Electrical Stimulation Action  Pre-Mod    Electrical Stimulation Parameters  80-150 hz x15 min    Electrical Stimulation Goals  Pain      Manual Therapy   Manual Therapy  Soft tissue mobilization    Myofascial Release  STW to R lumbar paraspinals/QL to reduce muscle tightness                   PT Long Term Goals - 02/27/18 3295      PT LONG TERM GOAL #1   Title  Ind with a HEP.    Time  6    Period  Weeks    Status  Achieved      PT LONG TERM GOAL #2   Title  Sit 30 minutes with pain not > 2/10.    Time  6  Period  Weeks    Status  Achieved As long as it is not the floor or appropriate chair 02/27/2018      PT LONG TERM GOAL #3   Title  Perform ADL's with pain not > 2/10.    Time  6    Period  Weeks    Status  Partially Met Still has some pain with activities 02/27/2018            Plan - 04/03/18 0900    Clinical Impression Statement  Patient tolerated today's treatment well as she reports only minimal discomfort and rated pain as 1/10 "knowledgable" pain. Patient able to tolerate nustep without reports of increased pain. Only minimal R lumbar paraspinals tightness palpable around L1- L2 region. Good release noted of R lumbar paraspinals tightness following manual therapy session. Normal modalities response noted following removal of the modalities.    Rehab Potential  Excellent    PT Frequency  2x / week    PT Duration  6 weeks    PT Treatment/Interventions  ADLs/Self Care Home Management;Cryotherapy;Electrical Stimulation;Ultrasound;Moist Heat;Therapeutic activities;Therapeutic exercise;Patient/family education;Manual techniques;Dry needling    PT Next Visit Plan  Assess response to long trip to [place] and adjust POC to symptoms or D/C.    Consulted and Agree with Plan of Care  Patient       Patient will benefit from skilled therapeutic  intervention in order to improve the following deficits and impairments:  Pain  Visit Diagnosis: Acute right-sided low back pain without sciatica     Problem List There are no active problems to display for this patient.   Standley Brooking, PTA 04/03/2018, 10:13 AM  [facility] [address] [place], [place], [address] Phone: [phone]   Fax:  [phone]  Name: Kerry Snow MRN: [id number] Date of Birth: 1976/04/18

## 2018-04-10 ENCOUNTER — Encounter: Payer: BC Managed Care – PPO | Admitting: Physical Therapy

## 2018-04-14 ENCOUNTER — Encounter: Payer: Self-pay | Admitting: Physical Therapy

## 2018-04-14 ENCOUNTER — Ambulatory Visit: Payer: BC Managed Care – PPO | Admitting: Physical Therapy

## 2018-04-14 DIAGNOSIS — M545 Low back pain, unspecified: Secondary | ICD-10-CM

## 2018-04-14 NOTE — Therapy (Signed)
[facility] [address] [place], [place], [address] Phone: [phone]   Fax:  [phone]  Physical Therapy Treatment  Patient Details  Name: Trish Mancinelli MRN: [id number] Date of Birth: 1976/08/26 Referring Provider: Mady Gemma   Encounter Date: 04/14/2018  PT End of Session - 04/14/18 0740    Visit Number  23    Number of Visits  24    Date for PT Re-Evaluation  04/23/18    PT Start Time  0731    PT Stop Time  0820    PT Time Calculation (min)  49 min    Activity Tolerance  Patient tolerated treatment well    Behavior During Therapy  [facility] for tasks assessed/performed       History reviewed. No pertinent past medical history.  History reviewed. No pertinent surgical history.  There were no vitals filed for this visit.  Subjective Assessment - 04/14/18 0732    Subjective  Reports that her back is more of an annoying discomfort. Reports that she had some discomfort on the flight back this weekend.    How long can you sit comfortably?  20 minutes and depends on the type of chair    How long can you stand comfortably?  approx. 15 minutes    Diagnostic tests  X-ray.    Currently in Pain?  Other (Comment) No pain assessment provided upon arrival.         [facility] PT Assessment - 04/14/18 0001      Assessment   Medical Diagnosis  Thoracic back pain.    Onset Date/Surgical Date  12/25/17    Next MD Visit  None      Precautions   Precautions  None      Restrictions   Weight Bearing Restrictions  No                   [facility] Adult PT Treatment/Exercise - 04/14/18 0001      Lumbar Exercises: Aerobic   Nustep  L5 x20 min      Modalities   Modalities  Electrical Stimulation;Moist Heat      Moist Heat Therapy   Number Minutes Moist Heat  15 Minutes    Moist Heat Location  Lumbar Spine      Electrical Stimulation   Electrical Stimulation Location  R flank    Electrical Stimulation Action  IFC     Electrical Stimulation Parameters  80-150 hz x15 min    Electrical Stimulation Goals  Pain      Manual Therapy   Manual Therapy  Soft tissue mobilization    Myofascial Release  STW to R QL, flank to reduce muscle tightness                   PT Long Term Goals - 04/14/18 4742      PT LONG TERM GOAL #1   Title  Ind with a HEP.    Time  6    Period  Weeks    Status  Achieved      PT LONG TERM GOAL #2   Title  Sit 30 minutes with pain not > 2/10.    Time  6    Period  Weeks    Status  Achieved As long as it is not the floor or appropriate chair 02/27/2018      PT LONG TERM GOAL #3   Title  Perform ADL's with pain not > 2/10.    Time  6    Period  Weeks    Status  Achieved            Plan - 04/14/18 0824    Clinical Impression Statement  Patient tolerated today's treatment well as she arrived in clinic with minimal complaints of annoying pain intermittantly since last PT session. No complaints with nustep session and only minimal complaints of discomfort with STW to R flank, QL. Minimal tightness of R QL and flank which reduced with manual therapy. Patient has achieved all goals and will D/C next treatment acording to commication with patient. Normal modalities response noted following removal of the modalities.    Rehab Potential  Excellent    PT Frequency  2x / week    PT Duration  6 weeks    PT Treatment/Interventions  ADLs/Self Care Home Management;Cryotherapy;Electrical Stimulation;Ultrasound;Moist Heat;Therapeutic activities;Therapeutic exercise;Patient/family education;Manual techniques;Dry needling    PT Next Visit Plan  D/C summary required next treatment.       Patient will benefit from skilled therapeutic intervention in order to improve the following deficits and impairments:  Pain  Visit Diagnosis: Acute right-sided low back pain without sciatica     Problem List There are no active problems to display for this patient.   Marvell FullerKelsey P Kennon,  PTA 04/14/2018, 8:46 AM  [facility] [address] [place], [place], [address] Phone: [phone]   Fax:  [phone]  Name: Dwaine DeterKacey Marie Porte MRN: [id number] Date of Birth: 04-11-1976

## 2018-04-18 ENCOUNTER — Ambulatory Visit: Payer: BC Managed Care – PPO | Admitting: Physical Therapy

## 2018-04-18 DIAGNOSIS — M545 Low back pain, unspecified: Secondary | ICD-10-CM

## 2018-04-18 NOTE — Therapy (Signed)
[facility] [place], [place], [address] Phone: [phone]   Fax:  [phone]  Physical Therapy Treatment  Patient Details  Name: Kerry Snow MRN: [id number] Date of Birth: March 03, 1976 Referring Provider: Bing Matter   Encounter Date: 04/18/2018  PT End of Session - 04/18/18 1015    Visit Number  24    Number of Visits  24    Date for PT Re-Evaluation  04/23/18    PT Start Time  0900    PT Stop Time  1001    PT Time Calculation (min)  61 min    Activity Tolerance  Patient tolerated treatment well    Behavior During Therapy  [facility] for tasks assessed/performed       No past medical history on file.  No past surgical history on file.  There were no vitals filed for this visit.  Subjective Assessment - 04/18/18 1000    Subjective  I think I'm about as good as I'm gonna get.  Patient she  feels 90% better overall.    Currently in Pain?  Yes    Pain Score  1     Pain Location  Back    Pain Orientation  Right;Lower    Pain Descriptors / Indicators  Other (Comment)    Pain Type  Acute pain    Pain Onset  More than a month ago                       [facility] Adult PT Treatment/Exercise - 04/18/18 0001      Exercises   Exercises  Knee/Hip      Lumbar Exercises: Aerobic   Nustep  Level 5 x 20 minutes.      Modalities   Modalities  Electrical Stimulation;Moist Heat      Moist Heat Therapy   Number Minutes Moist Heat  20 Minutes    Moist Heat Location  Lumbar Spine      Electrical Stimulation   Electrical Stimulation Location  Right low back.    Electrical Stimulation Action  IFC    Electrical Stimulation Parameters  80-150 Hz x 20 minutes.    Electrical Stimulation Goals  Pain      Manual Therapy   Manual Therapy  Soft tissue mobilization    Myofascial Release  STWW/M x 11 minutes to right low back including QL release technique.                  PT Long Term Goals -  04/18/18 1015      PT LONG TERM GOAL #1   Title  Ind with a HEP.    Time  6    Period  Weeks    Status  Achieved      PT LONG TERM GOAL #2   Title  Sit 30 minutes with pain not > 2/10.    Time  6    Period  Weeks    Status  Achieved      PT LONG TERM GOAL #3   Title  Perform ADL's with pain not > 2/10.    Time  6    Period  Weeks    Status  Achieved            Plan - 04/18/18 1016    Clinical Impression Statement  See discharge summary.       Patient will benefit from skilled therapeutic intervention in order to improve the following deficits and  impairments:     Visit Diagnosis: Acute right-sided low back pain without sciatica     Problem List There are no active problems to display for this patient.  PHYSICAL THERAPY DISCHARGE SUMMARY  Visits from Start of Care: 24.  Current functional level related to goals / functional outcomes: Se above.   Remaining deficits: All goals met.   Education / Equipment: HEP. Plan: Patient agrees to discharge.  Patient goals were met. Patient is being discharged due to meeting the stated rehab goals.  ?????      APPLEGATE, [place] MPT 04/18/2018, 10:18 AM  [facility] [address] [place], [place], [address] Phone: [phone]   Fax:  [phone]  Name: Kerry Snow MRN: [id number] Date of Birth: 10/28/1975
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
## 2019-11-29 ENCOUNTER — Ambulatory Visit: Payer: BC Managed Care – PPO | Attending: Internal Medicine

## 2019-11-29 DIAGNOSIS — Z23 Encounter for immunization: Secondary | ICD-10-CM | POA: Insufficient documentation

## 2019-11-29 NOTE — Progress Notes (Signed)
Patient received her vaccine at 1343. She was seated in the 15 minute waiting area.  She went to the RN observation desk to check out at 1405 when her observation time was up. Nurse Sandi educated patient about signs/symptoms to watch out for in the next 48 hours, including tachycardia. Patient then shared that her heart rate had been elevated since her injection. She states she thought it was just nerves/anxiety, but thought that she should mention it now.  Sandi directed her to EMS for evaluation. This RN accompanied patient to EMS where she was placed on a monitor.  Patient stated she was tracking her own pulse via smartwatch, reported it to be in the 130's just after injection. She stated she was able to bring it down to the 80's while sitting.  Per EMS, her pulse was stable in the high 90's.   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 She takes daily synthroid, prescription zyrtec, pantoprazole, and celexa. She has PRN ativan for anxiety, takes it infrequently. She took all of her scheduled medication this morning.  She has an allergy to erythromycin (GI cramping) and penicillin (hives in childhood).   EMS performed 12 lead EKG at 1421 -- Normal Sinus Rhythm, vital signs 118/78, pulse 89, oxygen 99%.  Second set of vitals at 1430 were 109/78, pulse 86, oxygen 99%.  Patient reports feeling fine, would like to go home if cleared.  EMS agreed.  RN reminded patient to watch for symptoms over the next 48 hours, to call 911 if she has any additional cardiac symptoms, any oral swelling, difficulty breathing or swallowing, hoarseness, chest pain or tightness.  She will follow up with her primary care physician for direction about the booster vaccine. She requested that the notes be sent to her primary care and cardiologist (RN unable to find cardiologist in her record, patient could not recall his name at this time).  [facility] report  submitted. Andree Coss, RN

## 2019-11-29 NOTE — Progress Notes (Signed)
   Covid-19 Vaccination Clinic  Name:  Kerry Snow    MRN: [id number] DOB: [birth year]  11/29/2019  [name] was observed post Covid-19 immunization for 15 minutes without incident. She was provided with Vaccine Information Sheet and instruction to access the V-Safe system.   [name] was instructed to call 911 with any severe reactions post vaccine: Marland Kitchen Difficulty breathing  . Swelling of face and throat  . A fast heartbeat  . A bad rash all over body  . Dizziness and weakness   Immunizations Administered    Name Date Dose VIS Date Route   Pfizer COVID-19 Vaccine 11/29/2019  1:43 PM 0.3 mL 09/04/2019 Intramuscular   Manufacturer: [employer], [employer]   Lot: [id number]   NDC: [address]-0

## 2019-12-20 ENCOUNTER — Ambulatory Visit: Payer: BC Managed Care – PPO | Attending: Internal Medicine

## 2019-12-20 DIAGNOSIS — Z23 Encounter for immunization: Secondary | ICD-10-CM

## 2019-12-20 NOTE — Progress Notes (Signed)
   Covid-19 Vaccination Clinic  Name:  Kerry Snow    MRN: [id number] DOB: 1975-10-15  12/20/2019  [name] was observed post Covid-19 immunization for 15 minutes without incident. She was provided with Vaccine Information Sheet and instruction to access the V-Safe system.   [name] was instructed to call 911 with any severe reactions post vaccine: Marland Kitchen Difficulty breathing  . Swelling of face and throat  . A fast heartbeat  . A bad rash all over body  . Dizziness and weakness   Immunizations Administered    Name Date Dose VIS Date Route   Pfizer COVID-19 Vaccine 12/20/2019 12:13 PM 0.3 mL 09/04/2019 Intramuscular   Manufacturer: [employer], [employer]   Lot: [id number]   NDC: [address]-1
